# Patient Record
Sex: Female | Born: 1945 | Race: White | Hispanic: No | Marital: Married | Smoking: Former smoker
Health system: Southern US, Community
[De-identification: ages and names within clinical notes are randomized; demographics above are authoritative.]

## PROBLEM LIST (undated history)

## (undated) DIAGNOSIS — S7400XA Injury of sciatic nerve at hip and thigh level, unspecified leg, initial encounter: Secondary | ICD-10-CM

## (undated) DIAGNOSIS — E079 Disorder of thyroid, unspecified: Secondary | ICD-10-CM

## (undated) HISTORY — PX: NASAL SINUS SURGERY: SHX719

## (undated) HISTORY — PX: JOINT REPLACEMENT: SHX530

## (undated) HISTORY — DX: Injury of sciatic nerve at hip and thigh level, unspecified leg, initial encounter: S74.00XA

## (undated) HISTORY — PX: HIP ARTHROPLASTY: SHX981

---

## 2015-07-16 ENCOUNTER — Emergency Department (HOSPITAL_BASED_OUTPATIENT_CLINIC_OR_DEPARTMENT_OTHER)
Admission: EM | Admit: 2015-07-16 | Discharge: 2015-07-16 | Disposition: A | Discharge status: Home / Self Care | Payer: Medicare Other | Attending: Emergency Medicine | Admitting: Emergency Medicine

## 2015-07-16 ENCOUNTER — Encounter (HOSPITAL_BASED_OUTPATIENT_CLINIC_OR_DEPARTMENT_OTHER): Payer: Self-pay | Admitting: *Deleted

## 2015-07-16 DIAGNOSIS — T23222A Burn of second degree of single left finger (nail) except thumb, initial encounter: Secondary | ICD-10-CM | POA: Insufficient documentation

## 2015-07-16 DIAGNOSIS — Y998 Other external cause status: Secondary | ICD-10-CM | POA: Diagnosis not present

## 2015-07-16 DIAGNOSIS — Z8639 Personal history of other endocrine, nutritional and metabolic disease: Secondary | ICD-10-CM | POA: Insufficient documentation

## 2015-07-16 DIAGNOSIS — Y9289 Other specified places as the place of occurrence of the external cause: Secondary | ICD-10-CM | POA: Insufficient documentation

## 2015-07-16 DIAGNOSIS — Y278XXA Contact with other hot objects, undetermined intent, initial encounter: Secondary | ICD-10-CM | POA: Diagnosis not present

## 2015-07-16 DIAGNOSIS — T23231A Burn of second degree of multiple right fingers (nail), not including thumb, initial encounter: Secondary | ICD-10-CM | POA: Diagnosis not present

## 2015-07-16 DIAGNOSIS — T23132A Burn of first degree of multiple left fingers (nail), not including thumb, initial encounter: Secondary | ICD-10-CM | POA: Insufficient documentation

## 2015-07-16 DIAGNOSIS — Y9389 Activity, other specified: Secondary | ICD-10-CM | POA: Diagnosis not present

## 2015-07-16 HISTORY — DX: Disorder of thyroid, unspecified: E07.9

## 2015-07-16 MED ORDER — HYDROCODONE-ACETAMINOPHEN 5-325 MG PO TABS
1.0000 | ORAL_TABLET | Freq: Once | ORAL | Status: AC
  Administered 2015-07-16: 1 via ORAL
  Filled 2015-07-16: qty 1

## 2015-07-16 MED ORDER — SILVER SULFADIAZINE 1 % EX CREA
TOPICAL_CREAM | Freq: Once | CUTANEOUS | Status: AC
  Administered 2015-07-16: 19:00:00 via TOPICAL
  Filled 2015-07-16: qty 85

## 2015-07-16 MED ORDER — HYDROCODONE-ACETAMINOPHEN 5-325 MG PO TABS: 1.0000 | ORAL_TABLET | Freq: Four times a day (QID) | ORAL | Status: AC | PRN
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
## 2015-07-16 NOTE — Discharge Instructions (Signed)
Burn Care Your skin is a natural barrier to infection. It is the largest organ of your body. Burns damage this natural protection. To help prevent infection, it is very important to follow your caregiver's instructions in the care of your burn. Burns are classified as:  First degree. There is only redness of the skin (erythema). No scarring is expected.  Second degree. There is blistering of the skin. Scarring may occur with deeper burns.  Third degree. All layers of the skin are injured, and scarring is expected. HOME CARE INSTRUCTIONS   Wash your hands well before changing your bandage.  Change your bandage as often as directed by your caregiver.  Remove the old bandage. If the bandage sticks, you may soak it off with cool, clean water.  Cleanse the burn thoroughly but gently with mild soap and water.  Pat the area dry with a clean, dry cloth.  Apply a thin layer of antibacterial cream to the burn.  Apply a clean bandage as instructed by your caregiver.  Keep the bandage as clean and dry as possible.  Elevate the affected area for the first 24 hours, then as instructed by your caregiver.  Only take over-the-counter or prescription medicines for pain, discomfort, or fever as directed by your caregiver. SEEK IMMEDIATE MEDICAL CARE IF:   You develop excessive pain.  You develop redness, tenderness, swelling, or red streaks near the burn.  The burned area develops yellowish-white fluid (pus) or a bad smell.  You have a fever. MAKE SURE YOU:   Understand these instructions.  Will watch your condition.  Will get help right away if you are not doing well or get worse. Document Released: 10/28/2005 Document Revised: 01/20/2012 Document Reviewed: 03/20/2011 Exit[employer] Patient Information 2015 [employer]. This information is not intended to replace advice given to you by your health care provider. Make sure you discuss any questions you have with your health care  provider.  
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
## 2017-03-02 ENCOUNTER — Emergency Department (HOSPITAL_BASED_OUTPATIENT_CLINIC_OR_DEPARTMENT_OTHER)
Admission: EM | Admit: 2017-03-02 | Discharge: 2017-03-02 | Disposition: A | Discharge status: Home / Self Care | Payer: Medicare Other | Attending: Emergency Medicine | Admitting: Emergency Medicine

## 2017-03-02 ENCOUNTER — Encounter (HOSPITAL_BASED_OUTPATIENT_CLINIC_OR_DEPARTMENT_OTHER): Payer: Self-pay | Admitting: Emergency Medicine

## 2017-03-02 DIAGNOSIS — Z79899 Other long term (current) drug therapy: Secondary | ICD-10-CM | POA: Insufficient documentation

## 2017-03-02 DIAGNOSIS — Z4801 Encounter for change or removal of surgical wound dressing: Secondary | ICD-10-CM | POA: Insufficient documentation

## 2017-03-02 DIAGNOSIS — Z5189 Encounter for other specified aftercare: Secondary | ICD-10-CM

## 2017-03-02 DIAGNOSIS — Z96641 Presence of right artificial hip joint: Secondary | ICD-10-CM | POA: Diagnosis not present

## 2017-03-02 MED ORDER — CEPHALEXIN 500 MG PO CAPS: 500.0000 mg | ORAL_CAPSULE | Freq: Four times a day (QID) | ORAL | 0 refills | Status: DC

## 2017-03-02 NOTE — Discharge Instructions (Signed)
Take Keflex 4 times daily for 5 days. Change dressing as discussed with Dr. Jacqulyn Bath. Please follow-up with your surgeon at your dedicated appointment on Thursday, but call their office tomorrow to make them aware of your current situation. Please return to the emergency department or call your doctor immediately if he develop any fevers, increasing pain, swelling, redness, red streaking from the area.

## 2017-03-02 NOTE — ED Notes (Signed)
Pt discharged to home with family. NAD.  

## 2017-03-02 NOTE — ED Triage Notes (Signed)
Pt presents to ed with complaints of right hip wound . Pt states she had surgery about a year ago and is now having problems with site.

## 2017-03-02 NOTE — ED Provider Notes (Signed)
[facility]   CSN: [id number] Arrival date & time: 03/02/17  1406     History   Chief Complaint Chief Complaint  Patient presents with  . Wound Check    HPI Dominique Henderson is a 71 y.o. 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 Patient and her family described the pseudotumor as causing a wound filled with plasma cells. The wound was are evaluated by her previous orthopedic doctor and she is being referred to as specialized orthopedic doctor, Dr. Dorris Carnes, at her appointment on Thursday. She was told that the wound may rupture and it did today while she was taking her dressing off. Patient and her family are concerned that it may get infected and request wound care instructions. Patient denies any fevers, new pain to the area. She also denies any chest pain, shortness of breath, abdominal pain, nausea, vomiting, urinary symptoms.  HPI  Past Medical History:  Diagnosis Date  . Thyroid disease     There are no active problems to display for this patient.   Past Surgical History:  Procedure Laterality Date  . HIP ARTHROPLASTY    . JOINT REPLACEMENT    . NASAL SINUS SURGERY      OB History    No data available       Home Medications    Prior to Admission medications   Medication Sig Start Date End Date Taking? Authorizing Provider  cephALEXin (KEFLEX) 500 MG capsule Take 1 capsule (500 mg total) by mouth 4 (four) times daily. 03/02/17   Emi Holes, PA-C  HYDROcodone-acetaminophen (NORCO/VICODIN) 5-325 MG per tablet Take 1 tablet by mouth every 6 (six) hours as needed for moderate pain. 07/16/15   Elwin Mocha, MD    Family History No family history on file.  Social History Social History  Substance Use Topics  . Smoking status:  Never Smoker  . Smokeless tobacco: Not on file  . Alcohol use Yes     Allergies   Bee venom   Review of Systems Review of Systems  Constitutional: Negative for chills and fever.  HENT: Negative for facial swelling and sore throat.   Respiratory: Negative for shortness of breath.   Cardiovascular: Negative for chest pain.  Gastrointestinal: Negative for abdominal pain, nausea and vomiting.  Genitourinary: Negative for dysuria.  Musculoskeletal: Positive for arthralgias (R hip pain). Negative for back pain.  Skin: Positive for wound. Negative for rash.  Neurological: Negative for headaches.  Psychiatric/Behavioral: The patient is not nervous/anxious.      Physical Exam Updated Vital Signs BP 127/81 (BP Location: Right Arm)   Pulse 100   Temp 97.7 F (36.5 C) (Oral)   Resp 18   Ht  (1.626 m)   Wt 75.8 kg   SpO2 97%   BMI 28.67 kg/m   Physical Exam  Constitutional: She appears well-developed and well-nourished. No distress.  HENT:  Head: Normocephalic and atraumatic.  Mouth/Throat: Oropharynx is clear and moist. No oropharyngeal exudate.  Eyes: Conjunctivae are normal. Pupils are equal, round, and reactive to light. Right eye exhibits no discharge. Left eye exhibits no discharge. No scleral icterus.  Neck: Normal range of motion. Neck supple. No thyromegaly present.  Cardiovascular: Normal rate, regular rhythm, normal heart sounds and intact distal pulses.  Exam reveals no  gallop and no friction rub.   No murmur heard. Pulmonary/Chest: Effort normal and breath sounds normal. No stridor. No respiratory distress. She has no wheezes. She has no rales.  Abdominal: Soft. Bowel sounds are normal. She exhibits no distension. There is no tenderness. There is no rebound and no guarding.  Musculoskeletal: She exhibits no edema.  Lymphadenopathy:    She has no cervical adenopathy.  Neurological: She is alert. Coordination normal.  Skin: Skin is warm and dry. No rash noted.  She is not diaphoretic. No pallor.  Mildly tender around the wound (see photo) on right lateral thigh, no warmth or erythema noted  Psychiatric: She has a normal mood and affect.  Nursing note and vitals reviewed.      ED Treatments / Results  Labs (all labs ordered are listed, but only abnormal results are displayed) Labs Reviewed - No data to display  EKG  EKG Interpretation None       Radiology No results found.  Procedures Procedures (including critical care time)  Medications Ordered in ED Medications - No data to display   Initial Impression / Assessment and Plan / ED Course  I have reviewed the triage vital signs and the nursing notes.  Pertinent labs & imaging results that were available during my care of the patient were reviewed by me and considered in my medical decision making (see chart for details).     No signs of infection at this time. Will treat with Keflex for prophylaxis. Wound care discussed with patient and family. Patient has appointment this week with specialized orthopedic surgeon for evaluation of this problem related to her pseudotumor in her right hip joint. Patient advised to call orthopedic surgeon to make them aware of situation. Return precautions discussed. Patient understands and agrees with plan. Patient vitals stable throughout ED course and discharged in satisfactory condition. Patient also evaluated by Dr. Jacqulyn Bath who guided the patient's management and agrees with plan.  Final Clinical Impressions(s) / ED Diagnoses   Final diagnoses:  Visit for wound check    New Prescriptions New Prescriptions   CEPHALEXIN (KEFLEX) 500 MG CAPSULE    Take 1 capsule (500 mg total) by mouth 4 (four) times daily.     Emi Holes, PA-C 03/02/17 1543    Maia Plan, MD 03/03/17 [phone]

## 2017-03-05 ENCOUNTER — Emergency Department (HOSPITAL_BASED_OUTPATIENT_CLINIC_OR_DEPARTMENT_OTHER)
Admission: EM | Admit: 2017-03-05 | Discharge: 2017-03-05 | Disposition: A | Discharge status: Home / Self Care | Payer: Medicare Other | Attending: Emergency Medicine | Admitting: Emergency Medicine

## 2017-03-05 ENCOUNTER — Encounter (HOSPITAL_BASED_OUTPATIENT_CLINIC_OR_DEPARTMENT_OTHER): Payer: Self-pay | Admitting: *Deleted

## 2017-03-05 DIAGNOSIS — L97812 Non-pressure chronic ulcer of other part of right lower leg with fat layer exposed: Secondary | ICD-10-CM | POA: Diagnosis not present

## 2017-03-05 DIAGNOSIS — S81801A Unspecified open wound, right lower leg, initial encounter: Secondary | ICD-10-CM

## 2017-03-05 DIAGNOSIS — Z79899 Other long term (current) drug therapy: Secondary | ICD-10-CM | POA: Diagnosis not present

## 2017-03-05 DIAGNOSIS — Z4801 Encounter for change or removal of surgical wound dressing: Secondary | ICD-10-CM | POA: Diagnosis present

## 2017-03-05 NOTE — ED Notes (Signed)
ED Provider at bedside. 

## 2017-03-05 NOTE — Discharge Instructions (Signed)
Follow with your orthopedic surgeon tomorrow to have him further evaluate this. Return for fevers sudden spreading redness or purulent drainage

## 2017-03-05 NOTE — ED Provider Notes (Signed)
[facility]   CSN: [id number] Arrival date & time: 03/05/17  2118  By signing my name below, I, Dominique Henderson, attest that this documentation has been prepared under the direction and in the presence of physician practitioner, Melene Plan, DO. Electronically Signed: Linna Henderson, Scribe. 03/05/2017. 9:52 PM.  History   Chief Complaint Chief Complaint  Patient presents with  . Wound Check    The history is provided by the patient. No language interpreter was used.  Wound Check  This is a new problem. The current episode started more than 2 days ago. The problem occurs constantly. The problem has been gradually worsening. Pertinent negatives include no chest pain, no abdominal pain, no headaches and no shortness of breath. Exacerbated by: palpation. Nothing relieves the symptoms. She has tried nothing for the symptoms.     HPI Comments: Dominique Henderson is a 71 y.o. female with PMHx of right hip arthroplasty with associated pseudotumor who presents to the Emergency Department for a wound check. Patient has a wound to her right hip associated with her pseudotumor and notes it has worsened over the last few days. She states "there is something coming out of it" and notes the area is painful to the touch. No pus-like discharge or spreading erythema. Pt denies fevers, chills, or any other associated symptoms. She has an appointment with her orthopedic physician, Dr. Dorris Carnes, for the same.  Past Medical History:  Diagnosis Date  . Thyroid disease     There are no active problems to display for this patient.   Past Surgical History:  Procedure Laterality Date  . HIP ARTHROPLASTY    . JOINT REPLACEMENT    . NASAL SINUS SURGERY      OB History    No data available       Home Medications    Prior to Admission medications   Medication Sig Start Date End Date Taking? Authorizing Provider  cephALEXin (KEFLEX) 500 MG capsule Take 1 capsule (500 mg total) by mouth  4 (four) times daily. 03/02/17   Emi Holes, PA-C  HYDROcodone-acetaminophen (NORCO/VICODIN) 5-325 MG per tablet Take 1 tablet by mouth every 6 (six) hours as needed for moderate pain. 07/16/15   Elwin Mocha, MD    Family History No family history on file.  Social History Social History  Substance Use Topics  . Smoking status: Never Smoker  . Smokeless tobacco: Not on file  . Alcohol use Yes     Allergies   Bee venom   Review of Systems Review of Systems  Constitutional: Negative for chills and fever.  HENT: Negative for congestion and rhinorrhea.   Eyes: Negative for redness and visual disturbance.  Respiratory: Negative for shortness of breath and wheezing.   Cardiovascular: Negative for chest pain and palpitations.  Gastrointestinal: Negative for abdominal pain, nausea and vomiting.  Genitourinary: Negative for dysuria and urgency.  Musculoskeletal: Negative for arthralgias and myalgias.  Skin: Positive for wound. Negative for pallor.  Neurological: Negative for dizziness and headaches.  All other systems reviewed and are negative.  Physical Exam Updated Vital Signs BP 119/82 (BP Location: Left Arm)   Pulse (!) 103   Temp 98.1 F (36.7 C) (Oral)   Resp 16   SpO2 100%   Physical Exam  Constitutional: She is oriented to person, place, and time. She appears well-developed and well-nourished. No distress.  HENT:  Head: Normocephalic and atraumatic.  Eyes: EOM are normal. Pupils are equal, round, and reactive to light.  Neck: Normal range of motion. Neck supple.  Cardiovascular: Normal rate and regular rhythm.  Exam reveals no gallop and no friction rub.   No murmur heard. Pulmonary/Chest: Effort normal. She has no wheezes. She has no rales.  Abdominal: Soft. She exhibits no distension. There is no tenderness.  Musculoskeletal: She exhibits no edema or tenderness.  Neurological: She is alert and oriented to person, place, and time.  Skin: Skin is warm and dry.  She is not diaphoretic.  Half-dollar sized ulceration to the right lateral aspect of the right hip. Fatty tissue was extruded from the opening. Removed with minimal pressure. No bleeding or surrounding erythema. No drainage.   Psychiatric: She has a normal mood and affect. Her behavior is normal.  Nursing note and vitals reviewed.  ED Treatments / Results  Labs (all labs ordered are listed, but only abnormal results are displayed) Labs Reviewed - No data to display  EKG  EKG Interpretation None       Radiology No results found.  Procedures Procedures (including critical care time)  DIAGNOSTIC STUDIES: Oxygen Saturation is 100% on RA, normal by my interpretation.    COORDINATION OF CARE: 9:58 PM Discussed treatment plan with pt at bedside and pt agreed to plan.  Medications Ordered in ED Medications - No data to display   Initial Impression / Assessment and Plan / ED Course  I have reviewed the triage vital signs and the nursing notes.  Pertinent labs & imaging results that were available during my care of the patient were reviewed by me and considered in my medical decision making (see chart for details).     71 yo F With a chief complaints of a wound to her right lateral hip. This is secondary to the patient having a pseudotumor after having her total hip replacement. She denies fevers or chills denies purulent drainage. On exam there is some fat extruding from the wound. No P relent drainage. No surrounding erythema or edema. Already on prophylactic keflex.  Follow-up with her orthopedic surgeon tomorrow.  11:13 PM:  I have discussed the diagnosis/risks/treatment options with the patient and family and believe the pt to be eligible for discharge home to follow-up with Ortho. We also discussed returning to the ED immediately if new or worsening sx occur. We discussed the sx which are most concerning (e.g., sudden worsening pain, fever, inability to tolerate by mouth) that  necessitate immediate return. Medications administered to the patient during their visit and any new prescriptions provided to the patient are listed below.  Medications given during this visit Medications - No data to display   The patient appears reasonably screen and/or stabilized for discharge and I doubt any other medical condition or other [facility] requiring further screening, evaluation, or treatment in the ED at this time prior to discharge.    Final Clinical Impressions(s) / ED Diagnoses   Final diagnoses:  Wound of right leg, initial encounter    New Prescriptions Discharge Medication List as of 03/05/2017 10:08 PM      I personally performed the services described in this documentation, which was scribed in my presence. The recorded information has been reviewed and is accurate.    Melene Plan, DO 03/05/17 2313

## 2018-08-25 ENCOUNTER — Other Ambulatory Visit: Payer: Self-pay

## 2018-08-25 ENCOUNTER — Encounter (HOSPITAL_BASED_OUTPATIENT_CLINIC_OR_DEPARTMENT_OTHER): Payer: Self-pay

## 2018-08-25 ENCOUNTER — Emergency Department (HOSPITAL_BASED_OUTPATIENT_CLINIC_OR_DEPARTMENT_OTHER)
Admission: EM | Admit: 2018-08-25 | Discharge: 2018-08-25 | Disposition: A | Discharge status: Other Acute Inpatient Hospital | Payer: Medicare Other | Attending: Emergency Medicine | Admitting: Emergency Medicine

## 2018-08-25 ENCOUNTER — Emergency Department (HOSPITAL_BASED_OUTPATIENT_CLINIC_OR_DEPARTMENT_OTHER): Payer: Medicare Other

## 2018-08-25 DIAGNOSIS — Z87891 Personal history of nicotine dependence: Secondary | ICD-10-CM | POA: Insufficient documentation

## 2018-08-25 DIAGNOSIS — N201 Calculus of ureter: Secondary | ICD-10-CM | POA: Diagnosis not present

## 2018-08-25 DIAGNOSIS — N390 Urinary tract infection, site not specified: Secondary | ICD-10-CM | POA: Diagnosis not present

## 2018-08-25 DIAGNOSIS — R109 Unspecified abdominal pain: Secondary | ICD-10-CM | POA: Diagnosis present

## 2018-08-25 DIAGNOSIS — M71051 Abscess of bursa, right hip: Secondary | ICD-10-CM | POA: Insufficient documentation

## 2018-08-25 DIAGNOSIS — Z96649 Presence of unspecified artificial hip joint: Secondary | ICD-10-CM | POA: Insufficient documentation

## 2018-08-25 DIAGNOSIS — L02419 Cutaneous abscess of limb, unspecified: Secondary | ICD-10-CM

## 2018-08-25 LAB — CBC WITH DIFFERENTIAL/PLATELET
Abs Immature Granulocytes: 0.05 10*3/uL (ref 0.00–0.07)
Basophils Absolute: 0.1 10*3/uL (ref 0.0–0.1)
Basophils Relative: 0 %
Eosinophils Absolute: 0.2 10*3/uL (ref 0.0–0.5)
Eosinophils Relative: 1 %
HCT: 42.7 % (ref 36.0–46.0)
Hemoglobin: 13.3 g/dL (ref 12.0–15.0)
Immature Granulocytes: 0 %
Lymphocytes Relative: 4 %
Lymphs Abs: 0.6 10*3/uL — ABNORMAL LOW (ref 0.7–4.0)
MCH: 28.4 pg (ref 26.0–34.0)
MCHC: 31.1 g/dL (ref 30.0–36.0)
MCV: 91.2 fL (ref 80.0–100.0)
Monocytes Absolute: 0.8 10*3/uL (ref 0.1–1.0)
Monocytes Relative: 6 %
Neutro Abs: 12.1 10*3/uL — ABNORMAL HIGH (ref 1.7–7.7)
Neutrophils Relative %: 89 %
Platelets: 302 10*3/uL (ref 150–400)
RBC: 4.68 MIL/uL (ref 3.87–5.11)
RDW: 17.5 % — ABNORMAL HIGH (ref 11.5–15.5)
WBC: 13.7 10*3/uL — ABNORMAL HIGH (ref 4.0–10.5)
nRBC: 0 % (ref 0.0–0.2)

## 2018-08-25 LAB — URINALYSIS, ROUTINE W REFLEX MICROSCOPIC
Bilirubin Urine: NEGATIVE
Glucose, UA: NEGATIVE mg/dL
Ketones, ur: NEGATIVE mg/dL
Nitrite: NEGATIVE
Protein, ur: NEGATIVE mg/dL
Specific Gravity, Urine: 1.015 (ref 1.005–1.030)
pH: 7 (ref 5.0–8.0)

## 2018-08-25 LAB — COMPREHENSIVE METABOLIC PANEL
ALT: 17 U/L (ref 0–44)
AST: 18 U/L (ref 15–41)
Albumin: 4 g/dL (ref 3.5–5.0)
Alkaline Phosphatase: 79 U/L (ref 38–126)
Anion gap: 8 (ref 5–15)
BUN: 24 mg/dL — ABNORMAL HIGH (ref 8–23)
CO2: 27 mmol/L (ref 22–32)
Calcium: 10.1 mg/dL (ref 8.9–10.3)
Chloride: 103 mmol/L (ref 98–111)
Creatinine, Ser: 0.7 mg/dL (ref 0.44–1.00)
GFR calc Af Amer: >60 mL/min (ref >60)
GFR calc non Af Amer: >60 mL/min (ref >60)
Glucose, Bld: 114 mg/dL — ABNORMAL HIGH (ref 70–99)
Potassium: 4 mmol/L (ref 3.5–5.1)
Sodium: 138 mmol/L (ref 135–145)
Total Bilirubin: 0.4 mg/dL (ref 0.3–1.2)
Total Protein: 7.1 g/dL (ref 6.5–8.1)

## 2018-08-25 LAB — URINALYSIS, MICROSCOPIC (REFLEX): RBC / HPF: >50 RBC/hpf (ref 0–5)

## 2018-08-25 LAB — LIPASE, BLOOD: Lipase: 53 U/L — ABNORMAL HIGH (ref 11–51)

## 2018-08-25 LAB — I-STAT CG4 LACTIC ACID, ED: Lactic Acid, Venous: 0.77 mmol/L (ref 0.5–1.9)

## 2018-08-25 MED ORDER — ONDANSETRON HCL 4 MG/2ML IJ SOLN
4.0000 mg | Freq: Once | INTRAMUSCULAR | Status: AC
  Administered 2018-08-25: 4 mg via INTRAVENOUS
  Filled 2018-08-25: qty 2

## 2018-08-25 MED ORDER — SODIUM CHLORIDE 0.9 % IV BOLUS
1000.0000 mL | Freq: Once | INTRAVENOUS | Status: AC
  Administered 2018-08-25: 1000 mL via INTRAVENOUS

## 2018-08-25 MED ORDER — SODIUM CHLORIDE 0.9 % IV SOLN
1.0000 g | Freq: Once | INTRAVENOUS | Status: AC
  Administered 2018-08-25: 1 g via INTRAVENOUS
  Filled 2018-08-25: qty 10

## 2018-08-25 MED ORDER — MORPHINE SULFATE (PF) 4 MG/ML IV SOLN
4.0000 mg | Freq: Once | INTRAVENOUS | Status: AC
  Administered 2018-08-25: 4 mg via INTRAVENOUS
  Filled 2018-08-25: qty 1

## 2018-08-25 MED ORDER — IOPAMIDOL (ISOVUE-300) INJECTION 61%
100.0000 mL | Freq: Once | INTRAVENOUS | Status: AC | PRN
  Administered 2018-08-25: 100 mL via INTRAVENOUS

## 2018-08-25 MED ORDER — SODIUM CHLORIDE 0.9 % IV SOLN
INTRAVENOUS | Status: DC | PRN
  Administered 2018-08-25: 250 mL via INTRAVENOUS

## 2018-08-25 MED ORDER — ACETAMINOPHEN 500 MG PO TABS
1000.0000 mg | ORAL_TABLET | Freq: Once | ORAL | Status: AC
  Administered 2018-08-25: 1000 mg via ORAL
  Filled 2018-08-25: qty 2

## 2018-08-25 NOTE — ED Provider Notes (Signed)
[facility] EMERGENCY DEPARTMENT Provider Note   CSN: [id number] Arrival date & time: 08/25/18  1320     History   Chief Complaint Chief Complaint  Patient presents with  . Abdominal Pain    HPI Dominique Henderson is a 72 y.o. female.  HPI Patient presents to the emergency department with abdominal discomfort that started around 2 AM.  Patient states that this been fairly severe mostly right-sided pain.  Patient states that she has had some nausea and vomiting.  She states she had had a PICC line for an infection around her prosthetic hip joint.  The patient states that she did not take any medications prior to arrival for her symptoms.  Patient states that nothing seems to make her condition better or worse.  The patient denies chest pain, shortness of breath, headache,blurred vision, neck pain, fever, cough, weakness, numbness, dizziness, anorexia, edema,  rash, back pain, dysuria, hematemesis, bloody stool, near syncope, or syncope. Past Medical History:  Diagnosis Date  . Thyroid disease     There are no active problems to display for this patient.   Past Surgical History:  Procedure Laterality Date  . HIP ARTHROPLASTY    . JOINT REPLACEMENT    . NASAL SINUS SURGERY       OB History   None      Home Medications    Prior to Admission medications   Medication Sig Start Date End Date Taking? Authorizing Provider  cephALEXin (KEFLEX) 500 MG capsule Take 1 capsule (500 mg total) by mouth 4 (four) times daily. 03/02/17   Emi Holes, PA-C  HYDROcodone-acetaminophen (NORCO/VICODIN) 5-325 MG per tablet Take 1 tablet by mouth every 6 (six) hours as needed for moderate pain. 07/16/15   Elwin Mocha, MD    Family History No family history on file.  Social History Social History   Tobacco Use  . Smoking status: Former Games developer  . Smokeless tobacco: Never Used  Substance Use Topics  . Alcohol use: Yes    Comment: occ  . Drug use: No     Allergies   Bee  venom   Review of Systems Review of Systems All other systems negative except as documented in the HPI. All pertinent positives and negatives as reviewed in the HPI.  Physical Exam Updated Vital Signs BP (!) 145/77 (BP Location: Left Arm)   Pulse 93   Temp 98 F (36.7 C) (Oral)   Resp 20   Ht 5\' 2"  (1.575 m)   Wt 73.5 kg   SpO2 100%   BMI 29.63 kg/m   Physical Exam  Constitutional: She is oriented to person, place, and time. She appears well-developed and well-nourished. No distress.  HENT:  Head: Normocephalic and atraumatic.  Mouth/Throat: Oropharynx is clear and moist.  Eyes: Pupils are equal, round, and reactive to light.  Neck: Normal range of motion. Neck supple.  Cardiovascular: Normal rate, regular rhythm and normal heart sounds. Exam reveals no gallop and no friction rub.  No murmur heard. Pulmonary/Chest: Effort normal and breath sounds normal. No respiratory distress. She has no wheezes.  Abdominal: Soft. Bowel sounds are normal. She exhibits no distension. There is generalized tenderness.  Neurological: She is alert and oriented to person, place, and time. She exhibits normal muscle tone. Coordination normal.  Skin: Skin is warm and dry. Capillary refill takes less than 2 seconds. No rash noted. No erythema.  Psychiatric: She has a normal mood and affect. Her behavior is normal.  Nursing note and  vitals reviewed.    ED Treatments / Results  Labs (all labs ordered are listed, but only abnormal results are displayed) Labs Reviewed  CBC WITH DIFFERENTIAL/PLATELET - Abnormal; Notable for the following components:      Result Value   WBC 13.7 (*)    RDW 17.5 (*)    Neutro Abs 12.1 (*)    Lymphs Abs 0.6 (*)    All other components within normal limits  COMPREHENSIVE METABOLIC PANEL  LIPASE, BLOOD  URINALYSIS, ROUTINE W REFLEX MICROSCOPIC  I-STAT CG4 LACTIC ACID, ED    EKG None  Radiology No results found.  Procedures Procedures (including critical  care time)  Medications Ordered in ED Medications - No data to display   Initial Impression / Assessment and Plan / ED Course  I have reviewed the triage vital signs and the nursing notes.  Pertinent labs & imaging results that were available during my care of the patient were reviewed by me and considered in my medical decision making (see chart for details).    She has ureteral stone along with infection and possible abscess in her hip joint.  Patient will be referred to [facility].  I spoke with the admitting team and they will take the patient in transfer.  Final Clinical Impressions(s) / ED Diagnoses   Final diagnoses:  None    ED Discharge Orders    None       Kyra Manges 08/25/18 2356    Azalia Bilis, MD 08/26/18 [phone]

## 2018-08-25 NOTE — ED Triage Notes (Signed)
C/o right side abd/flank pain, n/d x today- to triage in w/c-NAD

## 2018-08-25 NOTE — ED Notes (Signed)
Attempted two sticks for second set of blood cultures, unable to obtain

## 2018-08-25 NOTE — ED Notes (Signed)
Spoke with Albin Felling from bed placement at [facility]; waiting to hear from transport team.

## 2018-08-26 LAB — BLOOD CULTURE ID PANEL (REFLEXED)
ACINETOBACTER BAUMANNII: NOT DETECTED
CANDIDA GLABRATA: NOT DETECTED
CANDIDA KRUSEI: NOT DETECTED
Candida albicans: NOT DETECTED
Candida parapsilosis: NOT DETECTED
Candida tropicalis: NOT DETECTED
Carbapenem resistance: NOT DETECTED
ENTEROBACTERIACEAE SPECIES: DETECTED — AB
ESCHERICHIA COLI: NOT DETECTED
Enterobacter cloacae complex: NOT DETECTED
Enterococcus species: NOT DETECTED
Haemophilus influenzae: NOT DETECTED
KLEBSIELLA OXYTOCA: NOT DETECTED
KLEBSIELLA PNEUMONIAE: NOT DETECTED
Listeria monocytogenes: NOT DETECTED
Methicillin resistance: NOT DETECTED
NEISSERIA MENINGITIDIS: NOT DETECTED
PSEUDOMONAS AERUGINOSA: NOT DETECTED
Proteus species: DETECTED — AB
STREPTOCOCCUS SPECIES: NOT DETECTED
Serratia marcescens: NOT DETECTED
Staphylococcus aureus (BCID): NOT DETECTED
Staphylococcus species: NOT DETECTED
Streptococcus agalactiae: NOT DETECTED
Streptococcus pneumoniae: NOT DETECTED
Streptococcus pyogenes: NOT DETECTED
Vancomycin resistance: NOT DETECTED

## 2018-08-28 LAB — URINE CULTURE: Culture: >=100000 — AB

## 2018-08-28 LAB — CULTURE, BLOOD (ROUTINE X 2): SPECIAL REQUESTS: ADEQUATE

## 2018-08-29 ENCOUNTER — Telehealth: Payer: Self-pay

## 2018-08-29 NOTE — Telephone Encounter (Signed)
UC amd BC results for [facility] visit 08/25/18 faxed to [facility] [phone] Attn: Dr Winfred Burn per Pharm D

## 2020-02-01 IMAGING — CT CT ABD-PELV W/ CM
2 of 5 series · 16 of 46 positions shown, 18 images · IV contrast (APPLIED)
Comparison: None.

CLINICAL DATA: Right-sided abdominal pain since this morning

EXAM:
CT ABDOMEN AND PELVIS WITH CONTRAST
TECHNIQUE: Multidetector CT imaging of the abdomen and pelvis was performed
using the standard protocol following bolus administration of
intravenous contrast.
CONTRAST:  100mL [id number] IOPAMIDOL ([id number]) INJECTION 61%

[Series 2: axial st · axial · 0.98mm/px · z∈[-476,-52]mm · 13 of 97 slices shown, 15 images]
[im 6/97  soft-tissue]
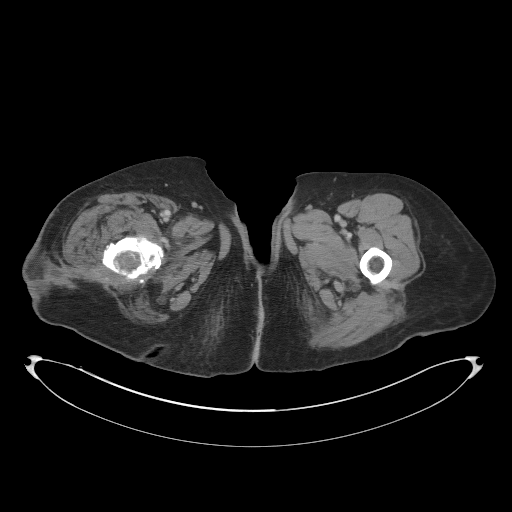
[im 6/97  bone]
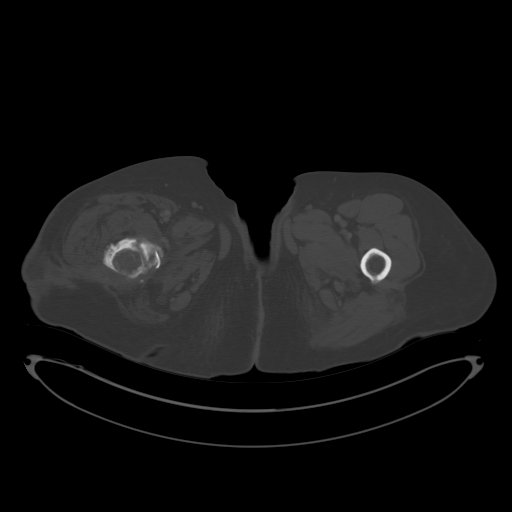
[im 16/97  soft-tissue]
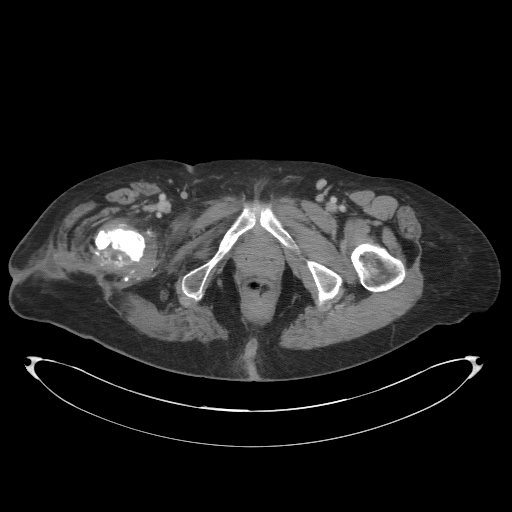
[im 21/97  soft-tissue]
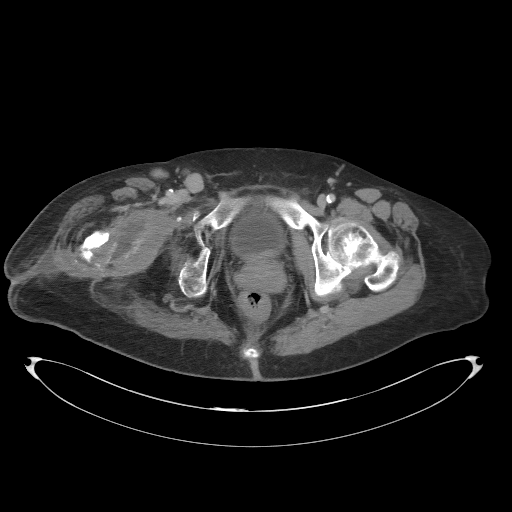
[im 26/97  soft-tissue]
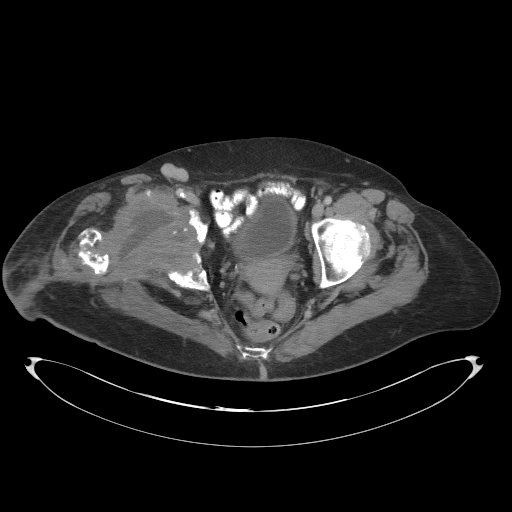
[im 36/97  soft-tissue]
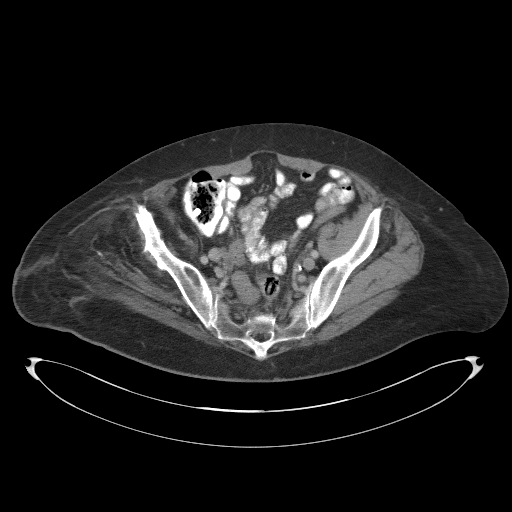
[im 41/97  soft-tissue]
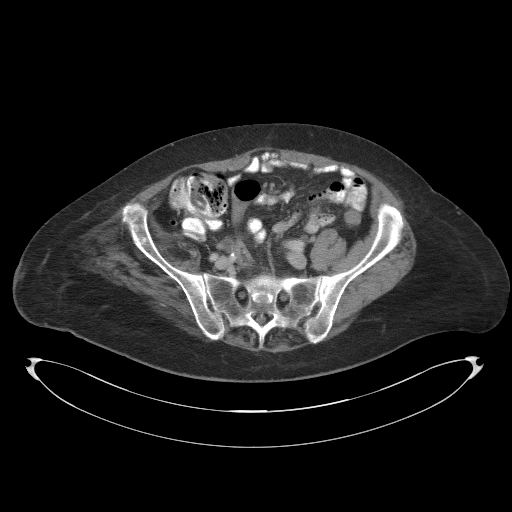
[im 51/97  soft-tissue]
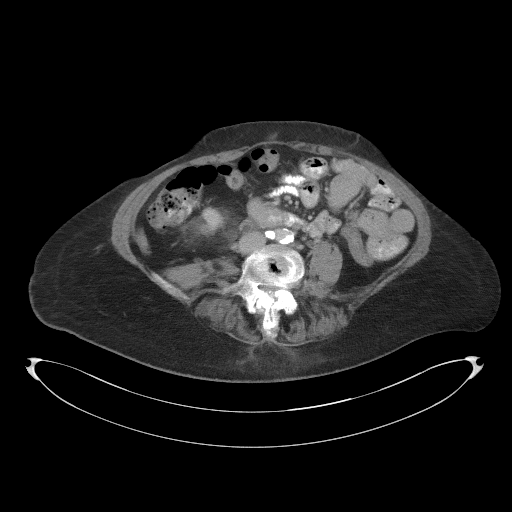
[im 56/97  soft-tissue]
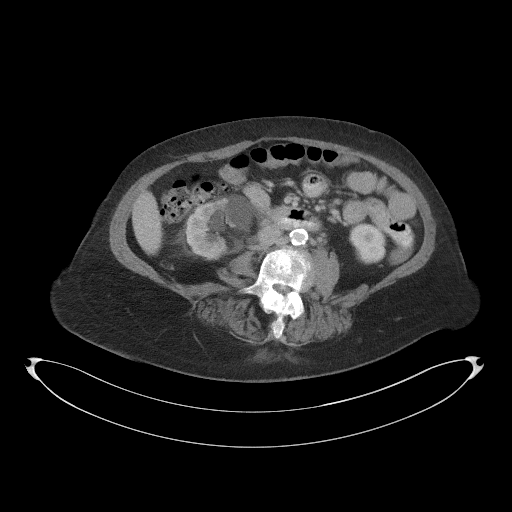
[im 61/97  soft-tissue]
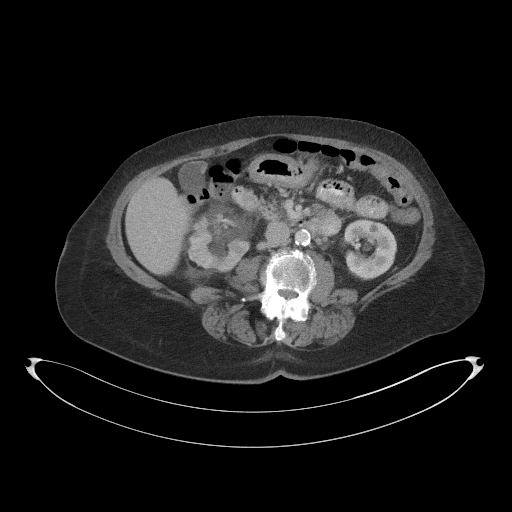
[im 61/97  bone]
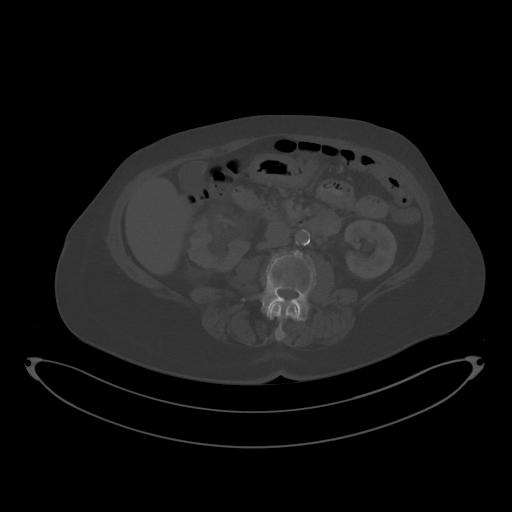
[im 71/97  soft-tissue]
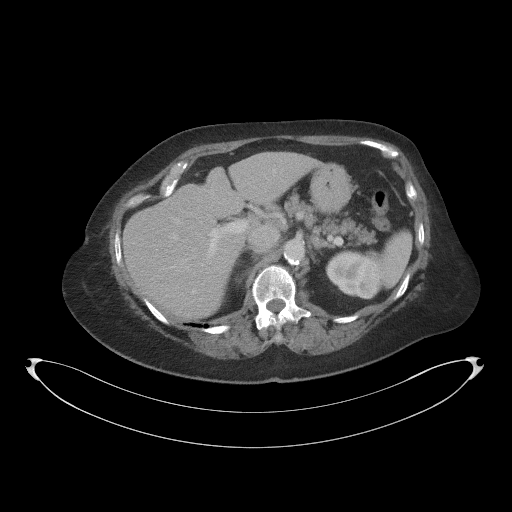
[im 76/97  soft-tissue]
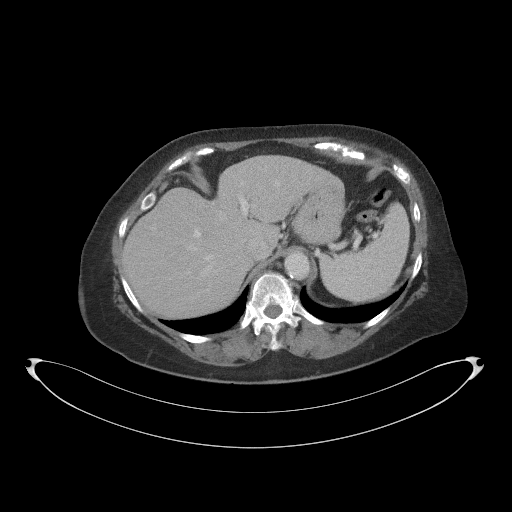
[im 81/97  soft-tissue]
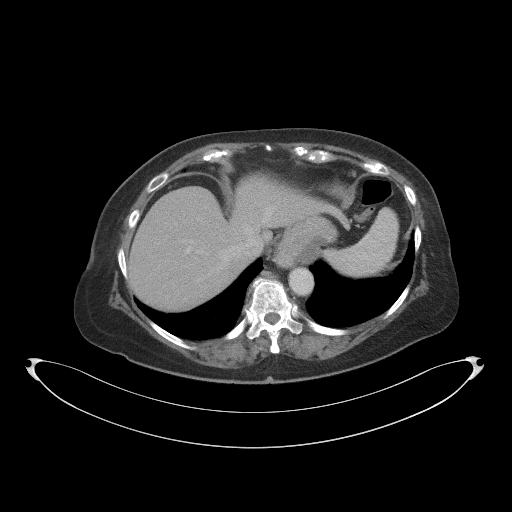
[im 91/97  soft-tissue]
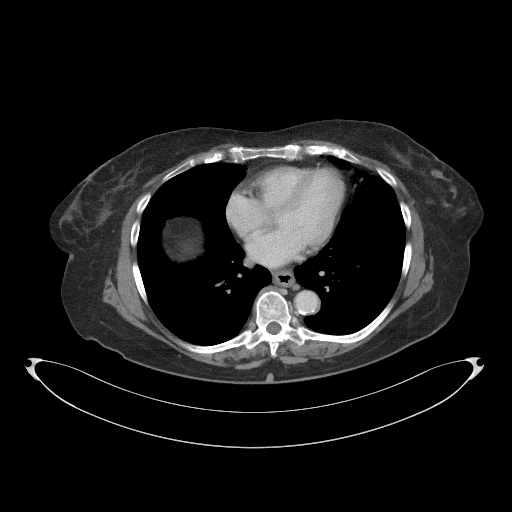

[Series 5: coronal st · coronal · 0.93mm/px · 3 of 91 slices shown]
[im 31/91  soft-tissue]
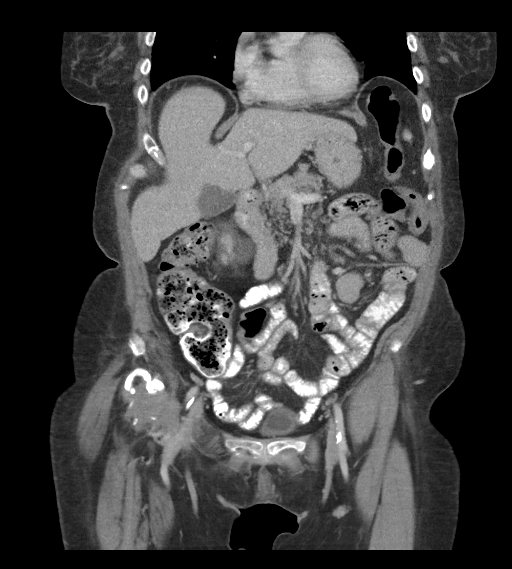
[im 41/91  soft-tissue]
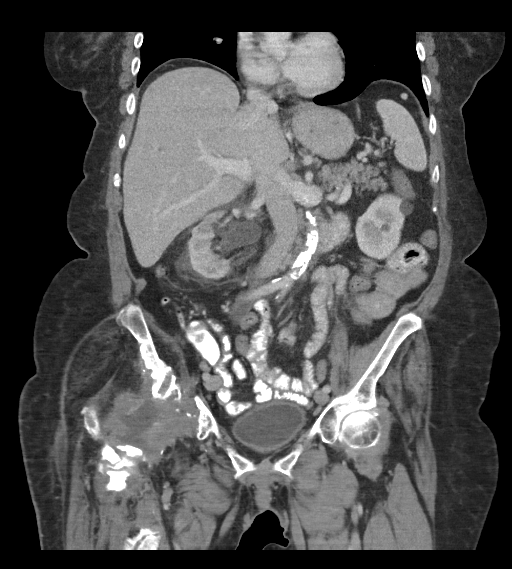
[im 51/91  soft-tissue]
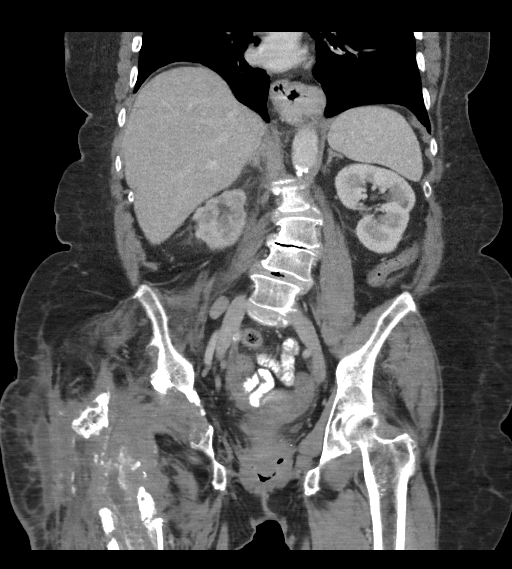

[16 of 46 positions shown; findings below may reference images not displayed]

FINDINGS: Lower chest: Small to moderate sliding hiatal hernia. No acute
finding

Hepatobiliary: No focal liver abnormality.No evidence of biliary
obstruction or stone.

Pancreas: Unremarkable.

Spleen: Unremarkable.

Adrenals/Urinary Tract: Negative adrenals. Right
hydroureteronephrosis, right perinephric stranding, and delayed
right renal excretion secondary to a 6 x 3 mm (measured on coronal
reformats) stone in the distal right ureter (at the level of the
bony pelvis). There is an elongated stone at the left UVJ measuring
4 x 3 mm. No left hydronephrosis. 4 mm stone at the left lower pole.
Bilateral renal cystic densities. Unremarkable bladder.

Stomach/Bowel:  No obstruction. No inflammatory changes

Vascular/Lymphatic: Atherosclerotic calcification that is confluent
along the aorta. Inguinal adenopathy on the right attributed to
chronic infection of the right hip.

Reproductive:Negative

Other: No ascites or pneumoperitoneum.

Musculoskeletal: History of chronic infection of right hip
prosthesis with subsequent removal. There is extensive fragmentation
and destruction of the right hemipelvis and upper right femur with
findings of partially covered medullary abscess in the femoral shaft
contiguous with fluid collection in the prosthetic defect that
measures up to 6 x 3 cm on axial slices. There is a distinct
subcutaneous triangular collection superficial to the trochanter.

Advanced lumbar degenerative disease with levoscoliosis and spinal
stenosis.
IMPRESSION: 1. Obstructing 6 x 3 mm stone at the distal right ureter.
2. Nonobstructing 3 mm stone at the left UVJ.
3. 4 mm left renal calculus.
4. History of prosthetic right hip complicated by infection with
subsequent explant. There is sizable collection/abscess within the
prosthetic defect extending into the medullary space of the femur.

## 2020-07-17 ENCOUNTER — Other Ambulatory Visit: Payer: Self-pay

## 2020-07-17 ENCOUNTER — Emergency Department (HOSPITAL_BASED_OUTPATIENT_CLINIC_OR_DEPARTMENT_OTHER)
Admission: EM | Admit: 2020-07-17 | Discharge: 2020-07-17 | Disposition: A | Discharge status: Home / Self Care | Payer: Medicare Other | Attending: Emergency Medicine | Admitting: Emergency Medicine

## 2020-07-17 ENCOUNTER — Encounter (HOSPITAL_BASED_OUTPATIENT_CLINIC_OR_DEPARTMENT_OTHER): Payer: Self-pay

## 2020-07-17 DIAGNOSIS — N390 Urinary tract infection, site not specified: Secondary | ICD-10-CM | POA: Diagnosis not present

## 2020-07-17 DIAGNOSIS — R300 Dysuria: Secondary | ICD-10-CM | POA: Diagnosis present

## 2020-07-17 DIAGNOSIS — Z96649 Presence of unspecified artificial hip joint: Secondary | ICD-10-CM | POA: Insufficient documentation

## 2020-07-17 DIAGNOSIS — Z87891 Personal history of nicotine dependence: Secondary | ICD-10-CM | POA: Insufficient documentation

## 2020-07-17 LAB — URINALYSIS, MICROSCOPIC (REFLEX)

## 2020-07-17 LAB — URINALYSIS, ROUTINE W REFLEX MICROSCOPIC
Bilirubin Urine: NEGATIVE
Glucose, UA: NEGATIVE mg/dL
Hgb urine dipstick: NEGATIVE
Ketones, ur: NEGATIVE mg/dL
Nitrite: NEGATIVE
Protein, ur: NEGATIVE mg/dL
Specific Gravity, Urine: 1.01 (ref 1.005–1.030)
pH: 6.5 (ref 5.0–8.0)

## 2020-07-17 MED ORDER — CEPHALEXIN 250 MG PO CAPS
500.0000 mg | ORAL_CAPSULE | Freq: Once | ORAL | Status: AC
  Administered 2020-07-17: 500 mg via ORAL
  Filled 2020-07-17: qty 2

## 2020-07-17 MED ORDER — CEPHALEXIN 500 MG PO CAPS: 500.0000 mg | ORAL_CAPSULE | Freq: Four times a day (QID) | ORAL | 0 refills | Status: AC

## 2020-07-17 NOTE — ED Provider Notes (Signed)
[facility] EMERGENCY DEPARTMENT Provider Note   CSN: [id number] Arrival date & time: 07/17/20  1718     History Chief Complaint  Patient presents with  . Dysuria    Dominique Henderson is a 74 y.o. female.  HPI Patient presents with dysuria.  Has had for the last few days.  Going more frequently.  Some burning which goes.  No abdominal pain.  Feels a little off but not more confused.  No flank pain.  Has had urinary tract fractions in the past and this feels the same.  Presents with her family member.  Has had good oral intake.    Past Medical History:  Diagnosis Date  . Sciatic nerve injury   . Thyroid disease     There are no problems to display for this patient.   Past Surgical History:  Procedure Laterality Date  . HIP ARTHROPLASTY    . JOINT REPLACEMENT    . NASAL SINUS SURGERY       OB History   No obstetric history on file.     No family history on file.  Social History   Tobacco Use  . Smoking status: Former Games developer  . Smokeless tobacco: Never Used  Vaping Use  . Vaping Use: Never used  Substance Use Topics  . Alcohol use: Yes    Comment: occ  . Drug use: No    Home Medications Prior to Admission medications   Medication Sig Start Date End Date Taking? Authorizing Provider  cephALEXin (KEFLEX) 500 MG capsule Take 1 capsule (500 mg total) by mouth 4 (four) times daily. 07/17/20   Benjiman Core, MD  HYDROcodone-acetaminophen (NORCO/VICODIN) 5-325 MG per tablet Take 1 tablet by mouth every 6 (six) hours as needed for moderate pain. 07/16/15   Elwin Mocha, MD    Allergies    Bee venom  Review of Systems   Review of Systems  Constitutional: Negative for appetite change.  Respiratory: Negative for shortness of breath.   Cardiovascular: Negative for chest pain.  Gastrointestinal: Negative for abdominal pain.  Genitourinary: Positive for dysuria. Negative for flank pain.  Musculoskeletal: Negative for back pain.  Skin: Negative for rash.    Neurological: Negative for weakness and numbness.    Physical Exam Updated Vital Signs BP (!) 151/95 (BP Location: Right Arm)   Pulse 90   Temp 98.4 F (36.9 C) (Oral)   Resp 20   Ht 5\' 2"  (1.575 m)   Wt 83.9 kg   SpO2 99%   BMI 33.84 kg/m   Physical Exam Vitals reviewed.  HENT:     Head: Normocephalic.  Eyes:     Pupils: Pupils are equal, round, and reactive to light.  Cardiovascular:     Rate and Rhythm: Regular rhythm.  Pulmonary:     Effort: No respiratory distress.  Abdominal:     Tenderness: There is no abdominal tenderness.  Genitourinary:    Comments: No CVA tenderness. Musculoskeletal:        General: No tenderness.     Cervical back: Neck supple.  Skin:    General: Skin is warm.     Capillary Refill: Capillary refill takes less than 2 seconds.  Neurological:     Mental Status: She is alert and oriented to person, place, and time.     ED Results / Procedures / Treatments   Labs (all labs ordered are listed, but only abnormal results are displayed) Labs Reviewed  URINALYSIS, ROUTINE W REFLEX MICROSCOPIC - Abnormal; Notable for the  following components:      Result Value   APPearance CLOUDY (*)    Leukocytes,Ua SMALL (*)    All other components within normal limits  URINALYSIS, MICROSCOPIC (REFLEX) - Abnormal; Notable for the following components:   Bacteria, UA MANY (*)    All other components within normal limits  URINE CULTURE    EKG None  Radiology No results found.  Procedures Procedures (including critical care time)  Medications Ordered in ED Medications  cephALEXin (KEFLEX) capsule 500 mg (has no administration in time range)    ED Course  I have reviewed the triage vital signs and the nursing notes.  Pertinent labs & imaging results that were available during my care of the patient were reviewed by me and considered in my medical decision making (see chart for details).    MDM Rules/Calculators/A&P                           Patient with UTI symptoms.  Urine shows likely infection.  Culture sent.  Well-appearing.  Do not think we need further blood work at this time.  Will discharge home with Keflex.  First dose given here. Final Clinical Impression(s) / ED Diagnoses Final diagnoses:  Lower urinary tract infectious disease    Rx / DC Orders ED Discharge Orders         Ordered    cephALEXin (KEFLEX) 500 MG capsule  4 times daily        07/17/20 2109           Benjiman Core, MD 07/17/20 2113

## 2020-07-17 NOTE — ED Triage Notes (Signed)
Pt c/o dysuria x 1 week-NAD-to triage in w/c

## 2020-07-20 LAB — URINE CULTURE: Culture: >=100000 — AB

## 2020-07-21 ENCOUNTER — Telehealth: Payer: Self-pay

## 2020-07-21 NOTE — Telephone Encounter (Signed)
Post ED Visit - Positive Culture Follow-up  Culture report reviewed by antimicrobial stewardship pharmacist: [facility] []  , Pharm.D. []  Enzo Bi, Pharm.D., BCPS AQ-ID []  , Pharm.D., BCPS [x]  Celedonio Miyamoto, Pharm.D., BCPS []  [place], Garvin Fila.D., BCPS, AAHIVP []  , Pharm.D., BCPS, AAHIVP []  Georgina Pillion, PharmD, BCPS []  , PharmD, BCPS []  [place], PharmD, BCPS []  [address], PharmD []  , PharmD, BCPS []  Estella Husk, PharmD  Pharmacy Team []  Lysle Pearl, PharmD []  , PharmD []  Phillips Climes, PharmD []  , Rph []  [employer]) , PharmD []  Verlan Friends, PharmD []  , PharmD []  Mervyn Gay, PharmD []  , PharmD []  Vinnie Level, PharmD []  [facility], PharmD []  , PharmD []  Len Childs, PharmD   Positive  Urine culture Treated with Cephalexin, organism sensitive to the same and no further patient follow-up is required at this time.  07/21/2020, 9:32 AM
